# Patient Record
Sex: Male | Born: 1995 | Hispanic: No | Marital: Single | Smoking: Current every day smoker
Health system: Southern US, Community
[De-identification: ages and names within clinical notes are randomized; demographics above are authoritative.]

## PROBLEM LIST (undated history)

## (undated) HISTORY — PX: ABDOMINAL SURGERY: SHX537

---

## 2016-05-22 ENCOUNTER — Emergency Department (HOSPITAL_COMMUNITY): Payer: No Typology Code available for payment source

## 2016-05-22 ENCOUNTER — Emergency Department (HOSPITAL_COMMUNITY)
Admission: EM | Admit: 2016-05-22 | Discharge: 2016-05-22 | Disposition: A | Discharge status: Home / Self Care | Payer: No Typology Code available for payment source | Attending: Emergency Medicine | Admitting: Emergency Medicine

## 2016-05-22 ENCOUNTER — Encounter (HOSPITAL_COMMUNITY): Payer: Self-pay | Admitting: Emergency Medicine

## 2016-05-22 DIAGNOSIS — S01511A Laceration without foreign body of lip, initial encounter: Secondary | ICD-10-CM | POA: Insufficient documentation

## 2016-05-22 DIAGNOSIS — F1290 Cannabis use, unspecified, uncomplicated: Secondary | ICD-10-CM | POA: Diagnosis not present

## 2016-05-22 DIAGNOSIS — S0181XA Laceration without foreign body of other part of head, initial encounter: Secondary | ICD-10-CM | POA: Diagnosis not present

## 2016-05-22 DIAGNOSIS — S0993XA Unspecified injury of face, initial encounter: Secondary | ICD-10-CM | POA: Diagnosis present

## 2016-05-22 DIAGNOSIS — S01512A Laceration without foreign body of oral cavity, initial encounter: Secondary | ICD-10-CM

## 2016-05-22 DIAGNOSIS — F17200 Nicotine dependence, unspecified, uncomplicated: Secondary | ICD-10-CM | POA: Insufficient documentation

## 2016-05-22 DIAGNOSIS — Y92410 Unspecified street and highway as the place of occurrence of the external cause: Secondary | ICD-10-CM | POA: Insufficient documentation

## 2016-05-22 DIAGNOSIS — Y999 Unspecified external cause status: Secondary | ICD-10-CM | POA: Insufficient documentation

## 2016-05-22 DIAGNOSIS — Y939 Activity, unspecified: Secondary | ICD-10-CM | POA: Insufficient documentation

## 2016-05-22 MED ORDER — BUPIVACAINE HCL (PF) 0.5 % IJ SOLN
10.0000 mL | Freq: Once | INTRAMUSCULAR | Status: AC
  Administered 2016-05-22: 10 mL
  Filled 2016-05-22: qty 30

## 2016-05-22 MED ORDER — METHOCARBAMOL 500 MG PO TABS
1000.0000 mg | ORAL_TABLET | Freq: Once | ORAL | Status: AC
  Administered 2016-05-22: 1000 mg via ORAL
  Filled 2016-05-22: qty 2

## 2016-05-22 MED ORDER — PENICILLIN V POTASSIUM 250 MG PO TABS
500.0000 mg | ORAL_TABLET | Freq: Once | ORAL | Status: AC
  Administered 2016-05-22: 500 mg via ORAL
  Filled 2016-05-22: qty 2

## 2016-05-22 MED ORDER — NAPROXEN 500 MG PO TABS: ORAL_TABLET | ORAL | 0 refills | Status: AC

## 2016-05-22 MED ORDER — PENICILLIN V POTASSIUM 500 MG PO TABS: 500.0000 mg | ORAL_TABLET | Freq: Three times a day (TID) | ORAL | 0 refills | Status: AC

## 2016-05-22 MED ORDER — METHOCARBAMOL 500 MG PO TABS: ORAL_TABLET | ORAL | 0 refills | Status: AC

## 2016-05-22 MED ORDER — IBUPROFEN 800 MG PO TABS
800.0000 mg | ORAL_TABLET | Freq: Once | ORAL | Status: AC
  Administered 2016-05-22: 800 mg via ORAL
  Filled 2016-05-22: qty 1
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
